# Patient Record
Sex: Male | Born: 1995 | Race: Black or African American | Hispanic: No | Marital: Single | Smoking: Never smoker
Health system: Southern US, Community
[De-identification: ages and names within clinical notes are randomized; demographics above are authoritative.]

## PROBLEM LIST (undated history)

## (undated) HISTORY — PX: MENISCUS REPAIR: SHX5179

---

## 2018-03-03 ENCOUNTER — Encounter: Payer: Self-pay | Admitting: Emergency Medicine

## 2018-03-03 ENCOUNTER — Emergency Department
Admission: EM | Admit: 2018-03-03 | Discharge: 2018-03-03 | Disposition: A | Discharge status: Home / Self Care | Payer: Self-pay | Source: Home / Self Care | Attending: Family Medicine | Admitting: Family Medicine

## 2018-03-03 ENCOUNTER — Emergency Department (INDEPENDENT_AMBULATORY_CARE_PROVIDER_SITE_OTHER): Payer: Self-pay

## 2018-03-03 DIAGNOSIS — M542 Cervicalgia: Secondary | ICD-10-CM

## 2018-03-03 DIAGNOSIS — M549 Dorsalgia, unspecified: Secondary | ICD-10-CM

## 2018-03-03 DIAGNOSIS — M546 Pain in thoracic spine: Secondary | ICD-10-CM

## 2018-03-03 DIAGNOSIS — M545 Low back pain, unspecified: Secondary | ICD-10-CM

## 2018-03-03 MED ORDER — IBUPROFEN 600 MG PO TABS: 600.0000 mg | ORAL_TABLET | Freq: Four times a day (QID) | ORAL | 0 refills | Status: DC | PRN

## 2018-03-03 MED ORDER — METHOCARBAMOL 500 MG PO TABS: 500.0000 mg | ORAL_TABLET | Freq: Two times a day (BID) | ORAL | 0 refills | Status: AC

## 2018-03-03 NOTE — Discharge Instructions (Signed)
°  Robaxin (methocarbamol) is a muscle relaxer and may cause drowsiness. Do not drink alcohol, drive, or operate heavy machinery while taking.  Please take the ibuprofen every 6-8 hours for pain and inflammation. You may also take acetaminophen (Tylenol) 500mg  every 4-6 hours for pain.  Please follow up with Sports Medicine in 1-2 weeks if not improving.
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
## 2018-05-03 ENCOUNTER — Emergency Department (INDEPENDENT_AMBULATORY_CARE_PROVIDER_SITE_OTHER)
Admission: EM | Admit: 2018-05-03 | Discharge: 2018-05-03 | Disposition: A | Discharge status: Home / Self Care | Payer: BLUE CROSS/BLUE SHIELD | Source: Home / Self Care | Attending: Family Medicine | Admitting: Family Medicine

## 2018-05-03 ENCOUNTER — Other Ambulatory Visit: Payer: Self-pay

## 2018-05-03 DIAGNOSIS — R369 Urethral discharge, unspecified: Secondary | ICD-10-CM | POA: Diagnosis not present

## 2018-05-03 DIAGNOSIS — Z202 Contact with and (suspected) exposure to infections with a predominantly sexual mode of transmission: Secondary | ICD-10-CM

## 2018-05-03 LAB — POCT URINALYSIS DIP (MANUAL ENTRY)
Bilirubin, UA: NEGATIVE
Blood, UA: NEGATIVE
GLUCOSE UA: NEGATIVE mg/dL
Ketones, POC UA: NEGATIVE mg/dL
Leukocytes, UA: NEGATIVE
Nitrite, UA: NEGATIVE
PROTEIN UA: NEGATIVE mg/dL
Urobilinogen, UA: 1 E.U./dL
pH, UA: 6 (ref 5.0–8.0)

## 2018-05-03 MED ORDER — CEFTRIAXONE SODIUM 250 MG IJ SOLR
250.0000 mg | Freq: Once | INTRAMUSCULAR | Status: AC
  Administered 2018-05-03: 250 mg via INTRAMUSCULAR

## 2018-05-03 MED ORDER — AZITHROMYCIN 500 MG PO TABS: ORAL_TABLET | ORAL | 0 refills | Status: DC

## 2018-05-03 NOTE — ED Provider Notes (Signed)
[facility]    CSN: [id number] Arrival date & time: 05/03/18  1752     History   Chief Complaint Chief Complaint  Patient presents with  . STD testing    HPI Jose Ward is a 22 y.o. male.   Patient complains of 3 week history of dysuria and urethral discharge.  No abdominal or pelvic pain.  No swelling or pain in testicles.  No rash present.  He reports that a previous sexual partner confirmed with chlamydia on 10/27.  The history is provided by the patient.  Dysuria  This is a new problem. The current episode started more than 1 week ago. The problem occurs constantly. The problem has not changed since onset.Pertinent negatives include no abdominal pain. Nothing aggravates the symptoms. Nothing relieves the symptoms. He has tried nothing for the symptoms.    History reviewed. No pertinent past medical history.  There are no active problems to display for this patient.   Past Surgical History:  Procedure Laterality Date  . MENISCUS REPAIR         Home Medications    Prior to Admission medications   Medication Sig Start Date End Date Taking? Authorizing Provider  azithromycin (ZITHROMAX) 500 MG tablet Take two tabs by mouth as a single dose 05/03/18   Lattie HawBeese, Stephen A, MD  ibuprofen (ADVIL,MOTRIN) 600 MG tablet Take 1 tablet (600 mg total) by mouth every 6 (six) hours as needed. 03/03/18   Lurene ShadowPhelps, Erin O, PA-C  methocarbamol (ROBAXIN) 500 MG tablet Take 1 tablet (500 mg total) by mouth 2 (two) times daily. 03/03/18   Lurene ShadowPhelps, Erin O, PA-C  phentermine 15 MG capsule Take by mouth. 02/28/18   [provider]    Family History History reviewed. No pertinent family history.  Social History Social History   Tobacco Use  . Smoking status: Never Smoker  . Smokeless tobacco: Never Used  Substance Use Topics  . Alcohol use: Not on file  . Drug use: Not on file     Allergies   Strawberry extract   Review of Systems Review of Systems    Constitutional: Negative for chills, diaphoresis, fatigue, fever and unexpected weight change.  Gastrointestinal: Negative for abdominal pain.  Genitourinary: Positive for discharge, dysuria and urgency. Negative for difficulty urinating, flank pain, frequency, genital sores, hematuria, penile pain, penile swelling, scrotal swelling and testicular pain.  All other systems reviewed and are negative.    Physical Exam Triage Vital Signs ED Triage Vitals [05/03/18 1831]  Enc Vitals Group     BP 133/89     Pulse Rate 74     Resp 18     Temp 98 F (36.7 C)     Temp Source Oral     SpO2 100 %     Weight 203 lb (92.1 kg)     Height 6' (1.829 m)     Head Circumference      Peak Flow      Pain Score 0     Pain Loc      Pain Edu?      Excl. in GC?    No data found.  Updated Vital Signs BP 133/89 (BP Location: Right Arm)   Pulse 74   Temp 98 F (36.7 C) (Oral)   Resp 18   Ht 6' (1.829 m)   Wt 92.1 kg   SpO2 100%   BMI 27.53 kg/m   Visual Acuity Right Eye Distance:   Left Eye Distance:  Bilateral Distance:    Right Eye Near:   Left Eye Near:    Bilateral Near:     Physical Exam Nursing notes and Vital Signs reviewed. Appearance:  Patient appears stated age, and in no acute distress.    Eyes:  Pupils are equal, round, and reactive to light and accomodation.  Extraocular movement is intact.  Conjunctivae are not inflamed   Pharynx:  Normal; moist mucous membranes  Neck:  Supple.  No adenopathy Lungs:  Clear to auscultation.  Breath sounds are equal.  Moving air well. Heart:  Regular rate and rhythm without murmurs, rubs, or gallops.  Abdomen:  Nontender without masses or hepatosplenomegaly.  Bowel sounds are present.  No CVA or flank tenderness.  No tenderness inguinal lymph nodes. Extremities:  No edema.  Skin:  No rash present.    GU exam deferred.  UC Treatments / Results  Labs (all labs ordered are listed, but only abnormal results are displayed) Labs  Reviewed  POCT URINALYSIS DIP (MANUAL ENTRY) - Abnormal; Notable for the following components:      Result Value   Spec Grav, UA >=1.030 (*)    All other components within normal limits  C. TRACHOMATIS/N. GONORRHOEAE RNA    EKG None  Radiology No results found.  Procedures Procedures (including critical care time)  Medications Ordered in UC Medications  cefTRIAXone (ROCEPHIN) injection 250 mg (has no administration in time range)    Initial Impression / Assessment and Plan / UC Course  I have reviewed the triage vital signs and the nursing notes.  Pertinent labs & imaging results that were available during my care of the patient were reviewed by me and considered in my medical decision making (see chart for details).    Administered empiric Rocephin 250mg  IM. Rx for azithromycin. GC/chlamydia pending.  Patient declines other STD testing.    Final Clinical Impressions(s) / UC Diagnoses   Final diagnoses:  Urethral discharge in male  Exposure to STD     Discharge Instructions     Avoid sexual contact until results of tests are confirmed as negative.  If positive, return in one week for test of cure.    ED Prescriptions    Medication Sig Dispense Auth. Provider   azithromycin (ZITHROMAX) 500 MG tablet Take two tabs by mouth as a single dose 2 tablet Lattie Haw, MD         Lattie Haw, MD 05/08/18 [id number]

## 2018-05-03 NOTE — ED Triage Notes (Signed)
Pt having painful urination, discharge for about 3 weeks. Previous partner confirmed with chlamydia on 10/27

## 2018-05-03 NOTE — Discharge Instructions (Addendum)
Avoid sexual contact until results of tests are confirmed as negative.  If positive, return in one week for test of cure.

## 2018-05-04 ENCOUNTER — Telehealth: Payer: Self-pay | Admitting: Emergency Medicine

## 2018-05-04 LAB — C. TRACHOMATIS/N. GONORRHOEAE RNA
C. trachomatis RNA, TMA: NOT DETECTED
N. gonorrhoeae RNA, TMA: NOT DETECTED

## 2018-05-04 NOTE — Telephone Encounter (Signed)
Spoke with patient who gave correct PW: gave him negative lab results.
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
## 2019-08-16 ENCOUNTER — Ambulatory Visit: Payer: Self-pay | Attending: Internal Medicine

## 2019-08-16 DIAGNOSIS — Z23 Encounter for immunization: Secondary | ICD-10-CM | POA: Insufficient documentation

## 2019-08-16 NOTE — Progress Notes (Signed)
   Covid-19 Vaccination Clinic  Name:  Jose Ward    MRN: [id number] DOB: 09-29-1995  08/16/2019  Jose Ward was observed post Covid-19 immunization for 15 minutes without incident. He was provided with Vaccine Information Sheet and instruction to access the V-Safe system.   Jose Ward was instructed to call 911 with any severe reactions post vaccine: Marland Kitchen Difficulty breathing  . Swelling of face and throat  . A fast heartbeat  . A bad rash all over body  . Dizziness and weakness   Immunizations Administered    Name Date Dose VIS Date Route   Pfizer COVID-19 Vaccine 08/16/2019 11:11 AM 0.3 mL 05/23/2019 Intramuscular   Manufacturer: [employer], [employer]   Lot: [id number]   NDC: [address]-5

## 2019-09-06 ENCOUNTER — Ambulatory Visit: Payer: Self-pay | Attending: Internal Medicine

## 2019-09-06 DIAGNOSIS — Z23 Encounter for immunization: Secondary | ICD-10-CM

## 2019-09-06 NOTE — Progress Notes (Signed)
   Covid-19 Vaccination Clinic  Name:  Jose Ward    MRN: [id number] DOB: Feb 26, 1996  09/06/2019  [name] was observed post Covid-19 immunization for 15 minutes without incident. He was provided with Vaccine Information Sheet and instruction to access the V-Safe system.   [name] was instructed to call 911 with any severe reactions post vaccine: Marland Kitchen Difficulty breathing  . Swelling of face and throat  . A fast heartbeat  . A bad rash all over body  . Dizziness and weakness   Immunizations Administered    Name Date Dose VIS Date Route   Pfizer COVID-19 Vaccine 09/06/2019 11:09 AM 0.3 mL 05/23/2019 Intramuscular   Manufacturer: [employer], [employer]   Lot: [id number]   NDC: [address]-4

## 2020-05-24 ENCOUNTER — Emergency Department (INDEPENDENT_AMBULATORY_CARE_PROVIDER_SITE_OTHER)
Admission: RE | Admit: 2020-05-24 | Discharge: 2020-05-24 | Disposition: A | Discharge status: Home / Self Care | Payer: 59 | Source: Ambulatory Visit

## 2020-05-24 ENCOUNTER — Other Ambulatory Visit: Payer: Self-pay

## 2020-05-24 VITALS — BP 138/92 | HR 70 | Temp 98.2°F | Resp 14

## 2020-05-24 DIAGNOSIS — S29012A Strain of muscle and tendon of back wall of thorax, initial encounter: Secondary | ICD-10-CM | POA: Diagnosis not present

## 2020-05-24 MED ORDER — PREDNISONE 20 MG PO TABS: ORAL_TABLET | ORAL | 1 refills | Status: AC

## 2020-05-24 NOTE — ED Provider Notes (Signed)
[facility]    CSN: [id number] Arrival date & time: 05/24/20  0841      History   Chief Complaint Chief Complaint  Patient presents with  . Shoulder Pain    HPI Jose Ward is a 24 y.o. male.   [facility] presenting with left scapular pain.    Patient presents to Urgent Care with complaints of left lower shoulder blade pain since a few days ago. Patient reports he thinks he may have pulled something in the gym, pain is the worst when he stretches and the pain radiates up toward his neck.   The problem has been going on for over a month.  Patient works out regularly, and pain is sharp when he moves his left arm up or out in front.  No shortness of breath  No one precipitating event.     History reviewed. No pertinent past medical history.  There are no problems to display for this patient.   Past Surgical History:  Procedure Laterality Date  . MENISCUS REPAIR         Home Medications    Prior to Admission medications   Medication Sig Start Date End Date Taking? Authorizing Provider  atorvastatin (LIPITOR) 40 MG tablet Take 40 mg by mouth daily.    [provider]  methocarbamol (ROBAXIN) 500 MG tablet Take 1 tablet (500 mg total) by mouth 2 (two) times daily. 03/03/18   Lurene Shadow, PA-C  predniSONE (DELTASONE) 20 MG tablet 2 daily with food 05/24/20   Elvina Sidle, MD  phentermine 15 MG capsule Take by mouth. 02/28/18 05/24/20  [provider]    Family History Family History  Problem Relation Age of Onset  . Healthy Mother   . Healthy Father     Social History Social History   Tobacco Use  . Smoking status: Never Smoker  . Smokeless tobacco: Never Used     Allergies   Strawberry extract   Review of Systems Review of Systems   Physical Exam Triage Vital Signs ED Triage Vitals  Enc Vitals Group     BP      Pulse      Resp      Temp      Temp src      SpO2      Weight       Height      Head Circumference      Peak Flow      Pain Score      Pain Loc      Pain Edu?      Excl. in GC?    No data found.  Updated Vital Signs BP (!) 138/92 (BP Location: Right Arm)   Pulse 70   Temp 98.2 F (36.8 C) (Oral)   Resp 14   SpO2 99%    Physical Exam Vitals and nursing note reviewed.  Constitutional:      General: He is not in acute distress.    Appearance: Normal appearance. He is normal weight.  HENT:     Head: Normocephalic.  Eyes:     Conjunctiva/sclera: Conjunctivae normal.  Cardiovascular:     Rate and Rhythm: Normal rate.  Pulmonary:     Effort: Pulmonary effort is normal.  Musculoskeletal:        General: Normal range of motion.     Cervical back: Normal range of motion and neck supple.     Comments: Patient is tender on medial  left scapula and along the lateral chest wall  Skin:    General: Skin is warm and dry.  Neurological:     General: No focal deficit present.     Mental Status: He is alert and oriented to person, place, and time.  Psychiatric:        Mood and Affect: Mood normal.        Thought Content: Thought content normal.        Judgment: Judgment normal.      UC Treatments / Results  Labs (all labs ordered are listed, but only abnormal results are displayed) Labs Reviewed - No data to display  EKG   Radiology No results found.  Procedures Procedures (including critical care time)  Medications Ordered in UC Medications - No data to display  Initial Impression / Assessment and Plan / UC Course  I have reviewed the triage vital signs and the nursing notes.  Pertinent labs & imaging results that were available during my care of the patient were reviewed by me and considered in my medical decision making (see chart for details).    Final Clinical Impressions(s) / UC Diagnoses   Final diagnoses:  Rhomboid muscle strain, initial encounter     Discharge Instructions     Continue gentle stretching and  light exercise.  Expect 7-10 days for recovery    ED Prescriptions    Medication Sig Dispense Auth. Provider   predniSONE (DELTASONE) 20 MG tablet 2 daily with food 10 tablet Elvina Sidle, MD     I have reviewed the PDMP during this encounter.   Elvina Sidle, MD 05/24/20 [phone]

## 2020-05-24 NOTE — Discharge Instructions (Addendum)
Continue gentle stretching and light exercise.  Expect 7-10 days for recovery

## 2020-05-24 NOTE — ED Triage Notes (Signed)
Patient presents to Urgent Care with complaints of left lower shoulder blade pain since a few days ago. Patient reports he thinks he may have pulled something in the gym, pain is the worst when he stretches and the pain radiates up toward his neck.

## 2020-05-28 ENCOUNTER — Encounter: Payer: Self-pay | Admitting: Sports Medicine
